# Patient Record
Sex: Female | Born: 1995 | Race: White | Hispanic: No | Marital: Single | Smoking: Never smoker
Health system: Southern US, Community
[De-identification: ages and names within clinical notes are randomized; demographics above are authoritative.]

---
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
## 2015-05-19 ENCOUNTER — Ambulatory Visit (INDEPENDENT_AMBULATORY_CARE_PROVIDER_SITE_OTHER): Payer: BLUE CROSS/BLUE SHIELD | Admitting: Family Medicine

## 2015-05-19 ENCOUNTER — Encounter: Payer: Self-pay | Admitting: Family Medicine

## 2015-05-19 VITALS — BP 102/70 | Temp 98.5°F | Ht 64.0 in | Wt 154.1 lb

## 2015-05-19 DIAGNOSIS — A084 Viral intestinal infection, unspecified: Secondary | ICD-10-CM

## 2015-05-19 MED ORDER — NORETHIN-ETH ESTRAD TRIPHASIC 0.5/0.75/1-35 MG-MCG PO TABS: 1.0000 | ORAL_TABLET | Freq: Every day | ORAL | Status: DC

## 2015-05-19 MED ORDER — ONDANSETRON 4 MG PO TBDP: 4.0000 mg | ORAL_TABLET | Freq: Three times a day (TID) | ORAL | Status: DC | PRN

## 2015-05-19 NOTE — Progress Notes (Signed)
   Subjective:    Patient ID: Michelle Golden, female    DOB: 02/12/1996, 19 y.o.   MRN: [id number]  Emesis  This is a new problem. The current episode started today. The problem occurs less than 2 times per day. The problem has been unchanged. The emesis has an appearance of stomach contents. There has been no fever. Associated symptoms include coughing. Associated symptoms comments: nausea. She has tried bed rest for the symptoms. The treatment provided no relief.   yest hit pretty hard., persistent nausea  Then today hit harcer and developed vomiting   No reflux no heartburn  occas vomiting   n o fever No energy   Patient also notes a desire to start birth control pills. Primarily for birth control purposes. Has become sexually active and would like to utilize protection patient does smoke   Review of Systems  Respiratory: Positive for cough.   Gastrointestinal: Positive for vomiting.       Objective:   Physical Exam  Alert vital stable HET normal lungs clear. Heart rare rhythm abdomen hyperactive bowel sounds no discrete tenderness mild diffuse tenderness      Assessment & Plan:  Impression viral gastroenteritis superimposed on chronic sensitive stomach. #2 birth control discussed warning signs discussed plan initiate birth control pills. Short-term diet discussed Zofran when necessary for nausea WSL

## 2017-07-28 ENCOUNTER — Ambulatory Visit: Payer: BLUE CROSS/BLUE SHIELD | Admitting: Nurse Practitioner

## 2017-07-28 ENCOUNTER — Encounter: Payer: Self-pay | Admitting: Nurse Practitioner

## 2017-07-28 VITALS — BP 110/68 | Ht 64.0 in | Wt 160.0 lb

## 2017-07-28 DIAGNOSIS — Z3009 Encounter for other general counseling and advice on contraception: Secondary | ICD-10-CM | POA: Diagnosis not present

## 2017-07-28 DIAGNOSIS — Z113 Encounter for screening for infections with a predominantly sexual mode of transmission: Secondary | ICD-10-CM | POA: Diagnosis not present

## 2017-07-28 DIAGNOSIS — F418 Other specified anxiety disorders: Secondary | ICD-10-CM | POA: Diagnosis not present

## 2017-07-28 MED ORDER — ESCITALOPRAM OXALATE 10 MG PO TABS: 10.0000 mg | ORAL_TABLET | Freq: Every day | ORAL | 2 refills | Status: DC

## 2017-07-28 MED ORDER — NORGESTIMATE-ETH ESTRADIOL 0.25-35 MG-MCG PO TABS: 1.0000 | ORAL_TABLET | Freq: Every day | ORAL | 11 refills | Status: DC

## 2017-07-28 NOTE — Patient Instructions (Addendum)
Nexplanon Kylessa/Mirena Nuvaring Depo Provera  Start birth control pill first Sunday after your next cycle begins; use back up method first pack.

## 2017-07-29 ENCOUNTER — Encounter: Payer: Self-pay | Admitting: Nurse Practitioner

## 2017-07-29 DIAGNOSIS — F418 Other specified anxiety disorders: Secondary | ICD-10-CM | POA: Insufficient documentation

## 2017-07-29 NOTE — Progress Notes (Signed)
Subjective: Presents with her best friend to discuss her chronic depression symptoms.  Patient defers being interviewed alone.  Her best friend was the one who encouraged her to come in today.  States she has had problems off and on with this most of her life beginning in her teens.  Did go for 2 therapy sessions when she was younger but did not like the therapist and no further intervention was done.  Has a history of cutting herself as a teen.  Denies any current self-harm.  Tries to eat a healthy diet with regular exercise.  Has some emotional lability.  Denies being diagnosed bipolar in the past.  Has been with the same sexual partner for the past 2 years.  Regular menses, heavy at times with severe cramps.  Currently using withdrawal method for birth control.  Had a normal menstrual cycle at the beginning of December.  Would like to start contraceptive.  Last intercourse was a few days ago.  Denies any true suicidal or homicidal thoughts or ideation.   [facility] 07/28/2017  Decreased Interest 3  Down, Depressed, Hopeless 3  PHQ - 2 Score 6  Altered sleeping 3  Tired, decreased energy 3  Change in appetite 3  Feeling bad or failure about yourself  2  Trouble concentrating 3  Moving slowly or fidgety/restless 3  Suicidal thoughts 2  PHQ-9 Score 25    Objective:   BP 110/68   Ht 5\' 4"  (1.626 m)   Wt 160 lb (72.6 kg)   BMI 27.46 kg/m  NAD.  Alert, oriented.  Lungs clear.  Heart regular rate and rhythm.  Thoughts logical coherent and relevant.  Mildly depressed affect.  Dressed appropriately.  Making good eye contact.  Assessment:  Problem List Items Addressed This Visit      Other   Depression with anxiety - Primary   Relevant Medications   escitalopram (LEXAPRO) 10 MG tablet    Other Visit Diagnoses    Encounter for general counseling and advice on contraceptive management       Screen for STD (sexually transmitted disease)       Relevant Orders   Chlamydia/Gonococcus/Trichomonas, NAA (Completed)       Plan:   Meds ordered this encounter  Medications  . escitalopram (LEXAPRO) 10 MG tablet    Sig: Take 1 tablet (10 mg total) by mouth daily.    Dispense:  30 tablet    Refill:  2    Order Specific Question:   Supervising Provider    Answer:   Merlyn AlbertLUKING, WILLIAM S [[id number]]  . norgestimate-ethinyl estradiol (ORTHO-CYCLEN,SPRINTEC,PREVIFEM) 0.25-35 MG-MCG tablet    Sig: Take 1 tablet by mouth daily.    Dispense:  1 Package    Refill:  11    Order Specific Question:   Supervising Provider    Answer:   Merlyn AlbertLUKING, WILLIAM S [[id number]]   Trial of Lexapro 10 mg daily.  Reviewed potential adverse effects.  DC med and call if any problems.  Patient agrees to seek help immediately if any suicidal thoughts or ideation.  Her best friend also agrees to help her seek help if needed.  Discussed contraceptive options.  Patient wishes to start with [facility]  Start first Sunday after her next cycle begins.  Use backup method the first pack.  Call back if any heavy bleeding or side effects. Recheck in 4-6 weeks, sooner if needed.

## 2017-07-30 LAB — CHLAMYDIA/GONOCOCCUS/TRICHOMONAS, NAA
CHLAMYDIA BY NAA: NEGATIVE
Gonococcus by NAA: NEGATIVE
Trich vag by NAA: NEGATIVE

## 2017-07-30 LAB — SPECIMEN STATUS REPORT

## 2017-09-07 ENCOUNTER — Ambulatory Visit: Payer: BLUE CROSS/BLUE SHIELD | Admitting: Nurse Practitioner

## 2017-09-28 ENCOUNTER — Encounter: Payer: Self-pay | Admitting: Family Medicine

## 2017-11-06 ENCOUNTER — Other Ambulatory Visit: Payer: Self-pay | Admitting: Nurse Practitioner

## 2017-12-18 ENCOUNTER — Ambulatory Visit: Payer: BLUE CROSS/BLUE SHIELD | Admitting: Nurse Practitioner

## 2017-12-18 ENCOUNTER — Encounter: Payer: Self-pay | Admitting: Nurse Practitioner

## 2017-12-18 VITALS — BP 118/78 | Ht 64.0 in | Wt 167.0 lb

## 2017-12-18 DIAGNOSIS — F418 Other specified anxiety disorders: Secondary | ICD-10-CM | POA: Diagnosis not present

## 2017-12-18 MED ORDER — CLONAZEPAM 0.5 MG PO TABS: ORAL_TABLET | ORAL | 0 refills | Status: DC

## 2017-12-18 NOTE — Progress Notes (Signed)
Subjective: Presents for recheck on her anxiety and depression.  Depression symptoms are much better on Lexapro 10 mg.  Having some breakthrough anxiety symptoms lately mainly due to her loss of her job.  Sleep disturbance, trouble going to sleep with early morning awakenings.  OTC supplements for sleep are no longer working.  Regular cycles, normal flow.  Denies any missed OCs.  Having significant breakthrough anxiety about 3 to 4 days/week. GAD 7 : Generalized Anxiety Score 12/18/2017  Nervous, Anxious, on Edge 2  Control/stop worrying 3  Worry too much - different things 3  Trouble relaxing 1  Restless 1  Easily annoyed or irritable 1  Afraid - awful might happen 1  Total GAD 7 Score 12  Anxiety Difficulty Very difficult   Depression screen [facility] 2/9 12/18/2017 07/28/2017  Decreased Interest 0 3  Down, Depressed, Hopeless 0 3  PHQ - 2 Score 0 6  Altered sleeping 1 3  Tired, decreased energy 1 3  Change in appetite 0 3  Feeling bad or failure about yourself  0 2  Trouble concentrating 0 3  Moving slowly or fidgety/restless 0 3  Suicidal thoughts 0 2  PHQ-9 Score 2 25  Difficult doing work/chores Somewhat difficult -   Denies suicidal or homicidal thoughts or ideation.  Objective:   BP 118/78   Ht  (1.626 m)   Wt 167 lb 0.4 oz (75.8 kg)   BMI 28.67 kg/m  NAD.  Alert, oriented.  Mildly anxious cheerful affect.  Lungs clear.  Heart regular rate and rhythm.  Thoughts logical coherent and relevant.  Dressed appropriately.  Making good eye contact.  Assessment:   Problem List Items Addressed This Visit      Other   Depression with anxiety - Primary       Plan:   Meds ordered this encounter  Medications  . clonazePAM (KLONOPIN) 0.5 MG tablet    Sig: Take 1/2-1 tab po BID prn anxiety    Dispense:  30 tablet    Refill:  0    Order Specific Question:   Supervising Provider    Answer:   Merlyn Albert [[id number]]   Discussed options.  Continue current dose of Lexapro for  now.  Use Klonopin sparingly mainly for extreme anxiety and for sleep as needed.  Patient advised not to take medicine if she becomes pregnant.  Discussed importance of stress reduction and regular activity.  Strongly recommend preventive health physical with Pap smear within the next few months. Return in about 3 months (around 03/20/2018) for recheck.

## 2017-12-19 ENCOUNTER — Encounter: Payer: Self-pay | Admitting: Nurse Practitioner

## 2018-02-07 ENCOUNTER — Other Ambulatory Visit: Payer: Self-pay | Admitting: Nurse Practitioner

## 2018-03-04 ENCOUNTER — Other Ambulatory Visit: Payer: Self-pay | Admitting: Nurse Practitioner

## 2018-03-05 NOTE — Telephone Encounter (Signed)
Ref times one 

## 2018-03-19 ENCOUNTER — Ambulatory Visit: Payer: BLUE CROSS/BLUE SHIELD | Admitting: Nurse Practitioner

## 2018-05-25 ENCOUNTER — Telehealth: Payer: Self-pay | Admitting: Family Medicine

## 2018-05-25 NOTE — Telephone Encounter (Signed)
Fax from pharmacy requesting refill on Ortho-Cyclen 0.25/35 tab. Take one tablet by mouth daily.

## 2018-05-27 NOTE — Telephone Encounter (Signed)
Three mo worth needs wellness visit (rec with Lillia Abed) before finished

## 2018-05-28 ENCOUNTER — Other Ambulatory Visit: Payer: Self-pay | Admitting: *Deleted

## 2018-05-28 MED ORDER — NORGESTIMATE-ETH ESTRADIOL 0.25-35 MG-MCG PO TABS: 1.0000 | ORAL_TABLET | Freq: Every day | ORAL | 2 refills | Status: AC

## 2018-05-28 NOTE — Telephone Encounter (Signed)
Refills sent with a note to pt that she needs office visit for physcial

## 2019-09-11 ENCOUNTER — Encounter: Payer: Self-pay | Admitting: Family Medicine

## 2019-09-12 ENCOUNTER — Encounter: Payer: Self-pay | Admitting: Family Medicine
